# Patient Record
Sex: Male | Born: 1965 | Race: Black or African American | Hispanic: No | Smoking: Current every day smoker
Health system: Southern US, Community
[De-identification: ages and names within clinical notes are randomized; demographics above are authoritative.]

---

## 2015-04-07 ENCOUNTER — Emergency Department (HOSPITAL_COMMUNITY): Payer: Self-pay

## 2015-04-07 ENCOUNTER — Emergency Department (HOSPITAL_COMMUNITY)
Admission: EM | Admit: 2015-04-07 | Discharge: 2015-04-08 | Disposition: A | Discharge status: Home / Self Care | Payer: Self-pay | Attending: Emergency Medicine | Admitting: Emergency Medicine

## 2015-04-07 ENCOUNTER — Encounter (HOSPITAL_COMMUNITY): Payer: Self-pay | Admitting: *Deleted

## 2015-04-07 DIAGNOSIS — S59902A Unspecified injury of left elbow, initial encounter: Secondary | ICD-10-CM | POA: Insufficient documentation

## 2015-04-07 DIAGNOSIS — Z720 Tobacco use: Secondary | ICD-10-CM | POA: Insufficient documentation

## 2015-04-07 DIAGNOSIS — Y939 Activity, unspecified: Secondary | ICD-10-CM | POA: Insufficient documentation

## 2015-04-07 DIAGNOSIS — Y999 Unspecified external cause status: Secondary | ICD-10-CM | POA: Insufficient documentation

## 2015-04-07 DIAGNOSIS — M719 Bursopathy, unspecified: Secondary | ICD-10-CM

## 2015-04-07 DIAGNOSIS — W228XXA Striking against or struck by other objects, initial encounter: Secondary | ICD-10-CM | POA: Insufficient documentation

## 2015-04-07 DIAGNOSIS — Y929 Unspecified place or not applicable: Secondary | ICD-10-CM | POA: Insufficient documentation

## 2015-04-07 LAB — CBC WITH DIFFERENTIAL/PLATELET
BASOS ABS: 0.1 10*3/uL (ref 0.0–0.1)
BASOS PCT: 1 % (ref 0–1)
EOS PCT: 1 % (ref 0–5)
Eosinophils Absolute: 0.1 10*3/uL (ref 0.0–0.7)
HCT: 41.6 % (ref 39.0–52.0)
Hemoglobin: 14.6 g/dL (ref 13.0–17.0)
Lymphocytes Relative: 37 % (ref 12–46)
Lymphs Abs: 2.9 10*3/uL (ref 0.7–4.0)
MCH: 30.2 pg (ref 26.0–34.0)
MCHC: 35.1 g/dL (ref 30.0–36.0)
MCV: 86 fL (ref 78.0–100.0)
MONO ABS: 0.9 10*3/uL (ref 0.1–1.0)
Monocytes Relative: 11 % (ref 3–12)
Neutro Abs: 3.9 10*3/uL (ref 1.7–7.7)
Neutrophils Relative %: 50 % (ref 43–77)
PLATELETS: 242 10*3/uL (ref 150–400)
RBC: 4.84 MIL/uL (ref 4.22–5.81)
RDW: 13.8 % (ref 11.5–15.5)
WBC: 7.7 10*3/uL (ref 4.0–10.5)

## 2015-04-07 LAB — BASIC METABOLIC PANEL
Anion gap: 6 (ref 5–15)
BUN: 14 mg/dL (ref 6–20)
CO2: 28 mmol/L (ref 22–32)
CREATININE: 1.18 mg/dL (ref 0.61–1.24)
Calcium: 9 mg/dL (ref 8.9–10.3)
Chloride: 107 mmol/L (ref 101–111)
GFR calc Af Amer: >60 mL/min (ref >60)
GFR calc non Af Amer: >60 mL/min (ref >60)
Glucose, Bld: 91 mg/dL (ref 70–99)
Potassium: 3.7 mmol/L (ref 3.5–5.1)
SODIUM: 141 mmol/L (ref 135–145)

## 2015-04-07 LAB — GRAM STAIN: Gram Stain: NONE SEEN

## 2015-04-07 MED ORDER — LIDOCAINE-EPINEPHRINE (PF) 2 %-1:200000 IJ SOLN
20.0000 mL | Freq: Once | INTRAMUSCULAR | Status: AC
  Administered 2015-04-07: 20 mL via INTRADERMAL
  Filled 2015-04-07: qty 20

## 2015-04-07 MED ORDER — DEXTROSE 5 % IV SOLN
1.0000 g | Freq: Once | INTRAVENOUS | Status: AC
  Administered 2015-04-07: 1 g via INTRAVENOUS
  Filled 2015-04-07: qty 10

## 2015-04-07 NOTE — ED Notes (Signed)
The pt has a painful swollen lt elbow.  One week ago something stuck in the elbow and since then he has had more pain and swelling  No drainage.  Lesion to the lt elbow

## 2015-04-07 NOTE — ED Notes (Signed)
Family at bedside. 

## 2015-04-07 NOTE — ED Notes (Signed)
Pt reports he thinks he hit his left elbow about a week ago, states he looked down and saw it bleeding and yesterday he noticed a "lump" on his elbow, denies pain. Full ROM in said arm

## 2015-04-07 NOTE — ED Provider Notes (Signed)
CSN: [id number]     Arrival date & time 04/07/15  1900 History   First MD Initiated Contact with Patient 04/07/15 2059     Chief Complaint  Patient presents with  . Elbow Pain   HPI Patient presents to the emergency room with complaints of left-sided elbow pain. Tums all started a week ago when he hit his elbow on the object. He did have some bleeding after that occurred. Yesterday he noticed that his elbow started become more swollen. Denies any pain. He denies any fevers or chills. No drainage. History reviewed. No pertinent past medical history. History reviewed. No pertinent past surgical history. No family history on file. History  Substance Use Topics  . Smoking status: Current Every Day Smoker  . Smokeless tobacco: Not on file  . Alcohol Use: Yes    Review of Systems  All other systems reviewed and are negative.     Allergies  Review of patient's allergies indicates no known allergies.  Home Medications   Prior to Admission medications   Medication Sig Start Date End Date Taking? Authorizing Provider  cephALEXin (KEFLEX) 500 MG capsule Take 1 capsule (500 mg total) by mouth 4 (four) times daily. 04/08/15   Linwood DibblesJon Azuri Bozard, MD  naproxen (NAPROSYN) 500 MG tablet Take 1 tablet (500 mg total) by mouth 2 (two) times daily with a meal. As needed for pain 04/08/15   Linwood DibblesJon Gustie Bobb, MD   BP 126/74 mmHg  Pulse 72  Temp(Src) 98.8 F (37.1 C)  Resp 14  Ht 5\' 10"  (1.778 m)  Wt 185 lb (83.915 kg)  BMI 26.54 kg/m2  SpO2 94% Physical Exam  Constitutional: He appears well-developed and well-nourished. No distress.  HENT:  Head: Normocephalic and atraumatic.  Right Ear: External ear normal.  Left Ear: External ear normal.  Eyes: Conjunctivae are normal. Right eye exhibits no discharge. Left eye exhibits no discharge. No scleral icterus.  Neck: Neck supple. No tracheal deviation present.  Cardiovascular: Normal rate.   Pulmonary/Chest: Effort normal. No stridor. No respiratory distress.   Musculoskeletal: He exhibits no edema.       Left elbow: He exhibits swelling and effusion. He exhibits normal range of motion, no deformity and no laceration. No tenderness found.  Neurological: He is alert. Cranial nerve deficit: no gross deficits.  Skin: Skin is warm and dry. No rash noted.  Psychiatric: He has a normal mood and affect.  Nursing note and vitals reviewed.   ED Course  INCISION AND DRAINAGE Date/Time: 04/08/2015 12:09 AM Performed by: Linwood DibblesKNAPP, Astria Jordahl Authorized by: Linwood DibblesKNAPP, Sharen Youngren Consent: Verbal consent obtained. Risks and benefits: risks, benefits and alternatives were discussed Consent given by: patient Indications for incision and drainage: bursitis. Location: left elbow. Anesthesia: local infiltration Local anesthetic: lidocaine 2% with epinephrine Anesthetic total: 2 ml Patient sedated: no Needle gauge: 18 Complexity: simple Drainage: serous Drainage amount: scant Patient tolerance: Patient tolerated the procedure well with no immediate complications   (including critical care time) Labs Review Labs Reviewed  GRAM STAIN  BODY FLUID CULTURE  CBC WITH DIFFERENTIAL/PLATELET  BASIC METABOLIC PANEL    Imaging Review Dg Elbow Complete Left  04/07/2015   CLINICAL DATA:  History of puncture wound on left elbow 1 week ago with pain and swelling. Initial encounter.  EXAM: LEFT ELBOW - COMPLETE 3+ VIEW  COMPARISON:  None.  FINDINGS: No fracture, dislocation or joint effusion is identified. Soft tissue swelling over the olecranon is identified. Soft tissues are otherwise unremarkable.  IMPRESSION: Soft tissue swelling over  the olecranon is consistent with bursitis which could be septic or aseptic.  No bony abnormality is identified.  There is no joint effusion.   Electronically Signed   By: Drusilla Kannerhomas  Dalessio M.D.   On: 04/07/2015 19:55      MDM   Final diagnoses:  Bursitis    Fluid obtained did not appear purulent. I suspect an inflammatory bursitis. Gram stain did  not show any bacteria. Culture was sent off for analysis. I will send home on oral abx and nsaids.  Follow up with health and wellness.  Linwood DibblesJon Izabellah Dadisman, MD 04/08/15 [phone]

## 2015-04-08 MED ORDER — CEPHALEXIN 500 MG PO CAPS
500.0000 mg | ORAL_CAPSULE | Freq: Four times a day (QID) | ORAL | Status: AC
Start: 2015-04-08 — End: ?

## 2015-04-08 MED ORDER — NAPROXEN 500 MG PO TABS: 500.0000 mg | ORAL_TABLET | Freq: Two times a day (BID) | ORAL | Status: AC

## 2015-04-08 NOTE — Discharge Instructions (Signed)
Bursitis °Bursitis is a swelling and soreness (inflammation) of a fluid-filled sac (bursa) that overlies and protects a joint. It can be caused by injury, overuse of the joint, arthritis or infection. The joints most likely to be affected are the elbows, shoulders, hips and knees. °HOME CARE INSTRUCTIONS  °· Apply ice to the affected area for 15-20 minutes each hour while awake for 2 days. Put the ice in a plastic bag and place a towel between the bag of ice and your skin. °· Rest the injured joint as much as possible, but continue to put the joint through a full range of motion, 4 times per day. (The shoulder joint especially becomes rapidly "frozen" if not used.) When the pain lessens, begin normal slow movements and usual activities. °· Only take over-the-counter or prescription medicines for pain, discomfort or fever as directed by your caregiver. °· Your caregiver may recommend draining the bursa and injecting medicine into the bursa. This may help the healing process. °· Follow all instructions for follow-up with your caregiver. This includes any orthopedic referrals, physical therapy and rehabilitation. Any delay in obtaining necessary care could result in a delay or failure of the bursitis to heal and chronic pain. °SEEK IMMEDIATE MEDICAL CARE IF:  °· Your pain increases even during treatment. °· You develop an oral temperature above 102° F (38.9° C) and have heat and inflammation over the involved bursa. °MAKE SURE YOU:  °· Understand these instructions. °· Will watch your condition. °· Will get help right away if you are not doing well or get worse. °Document Released: 11/17/2000 Document Revised: 02/12/2012 Document Reviewed: 02/09/2014 °[employer]® Patient Information ©2015 [employer]. This information is not intended to replace advice given to you by your health care provider. Make sure you discuss any questions you have with your health care provider. ° °

## 2015-04-08 NOTE — ED Notes (Signed)
Patient is alert and orientedx4.  Patient was explained discharge instructions and they understood them with no questions.  The patient's friend, Aundria MemsJackie Woodard is taking the patient home.

## 2015-04-11 LAB — BODY FLUID CULTURE: Culture: NO GROWTH

## 2016-10-11 IMAGING — CR DG ELBOW COMPLETE 3+V*L*
4 series · 4 of 4 positions shown · non-contrast
Comparison: None.

CLINICAL DATA: History of puncture wound on left elbow 1 week ago
with pain and swelling. Initial encounter.

EXAM:
LEFT ELBOW - COMPLETE 3+ VIEW

[elbow ap]
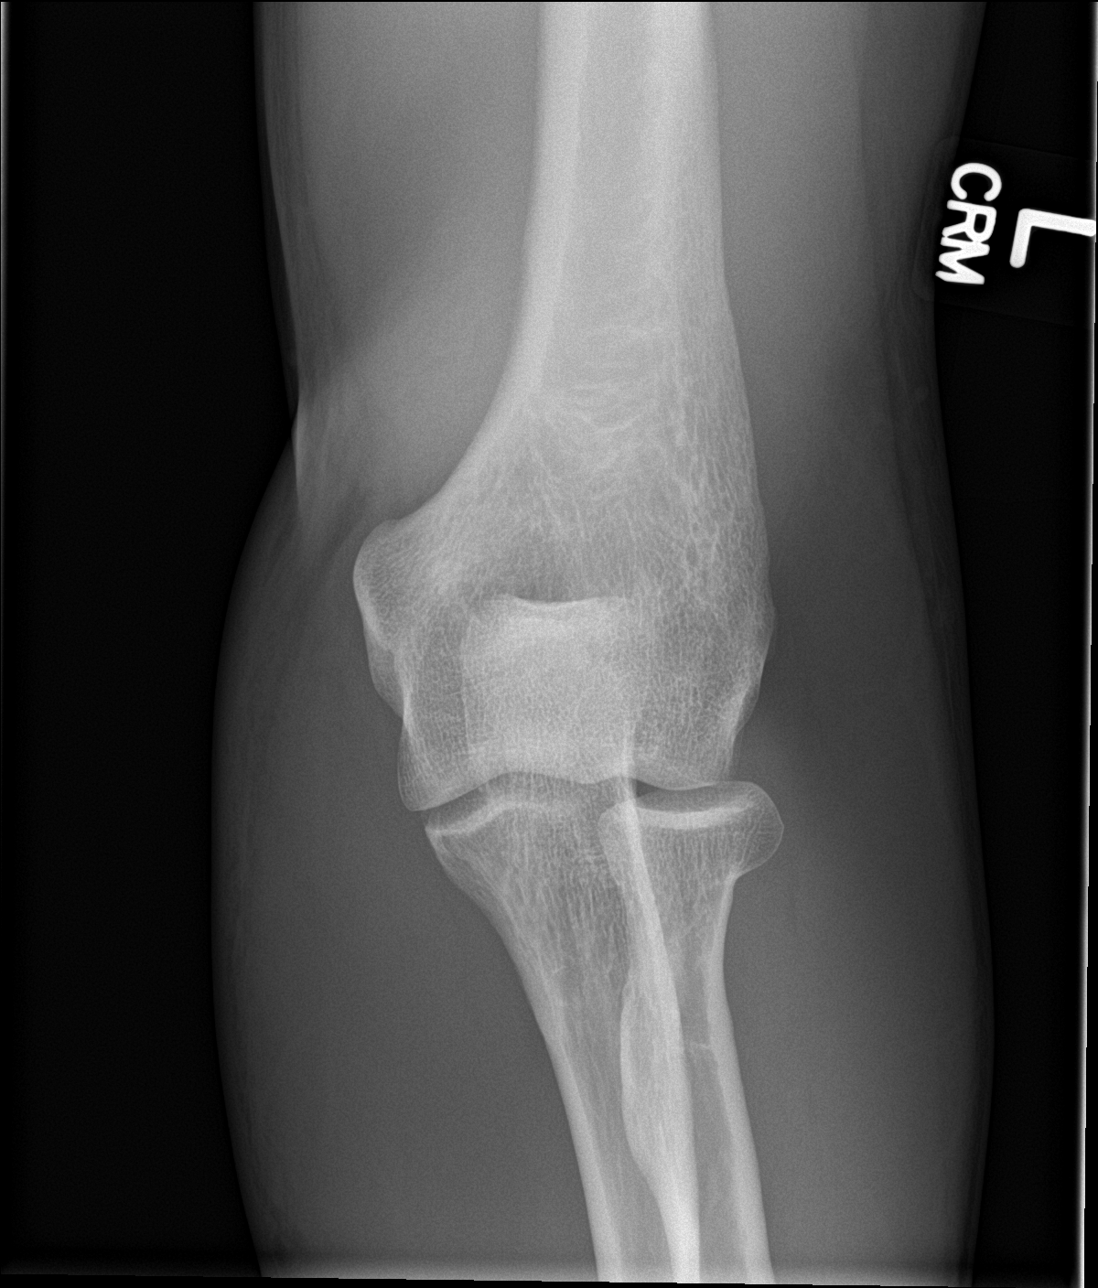

[elbow obl (1 of 2)]
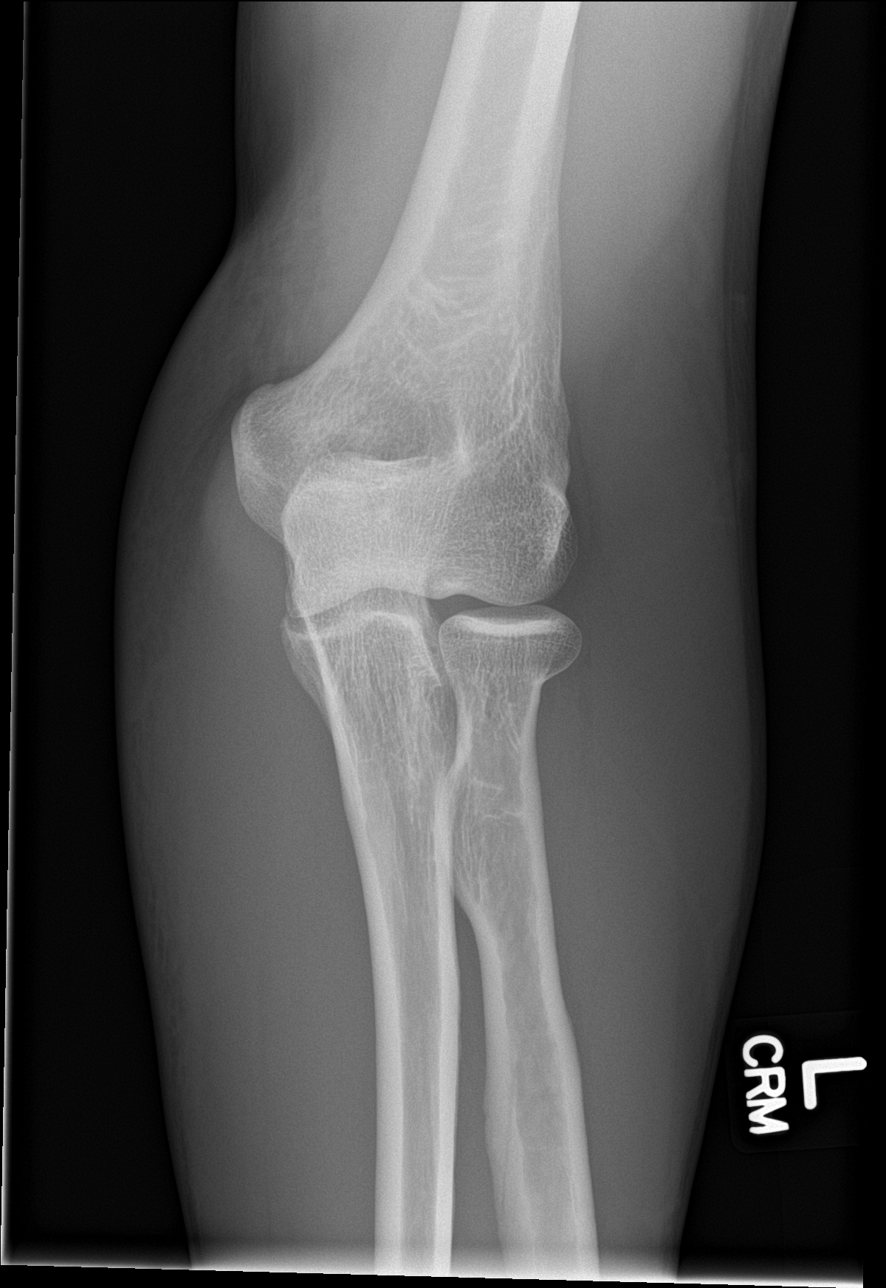

[elbow obl (2 of 2)]
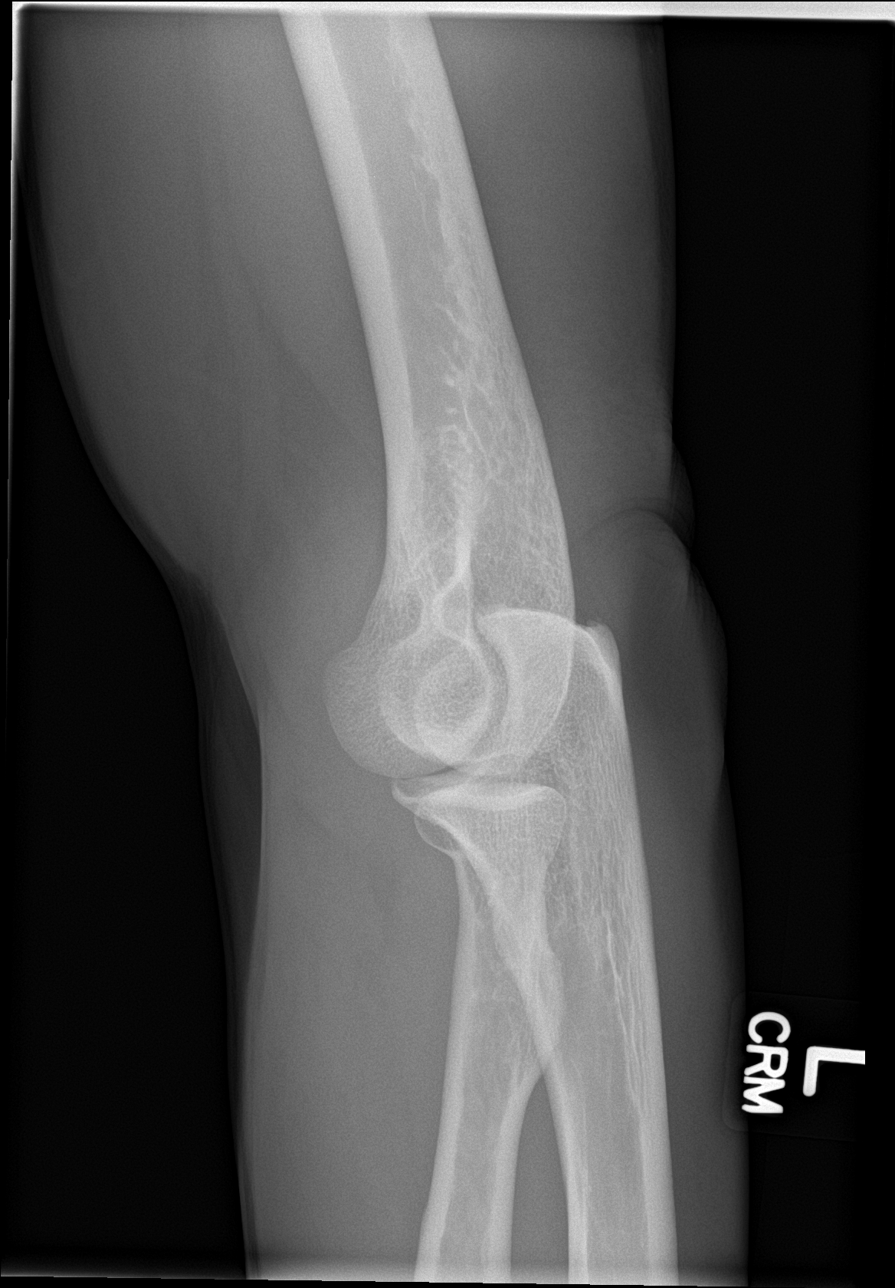

[elbow lat]
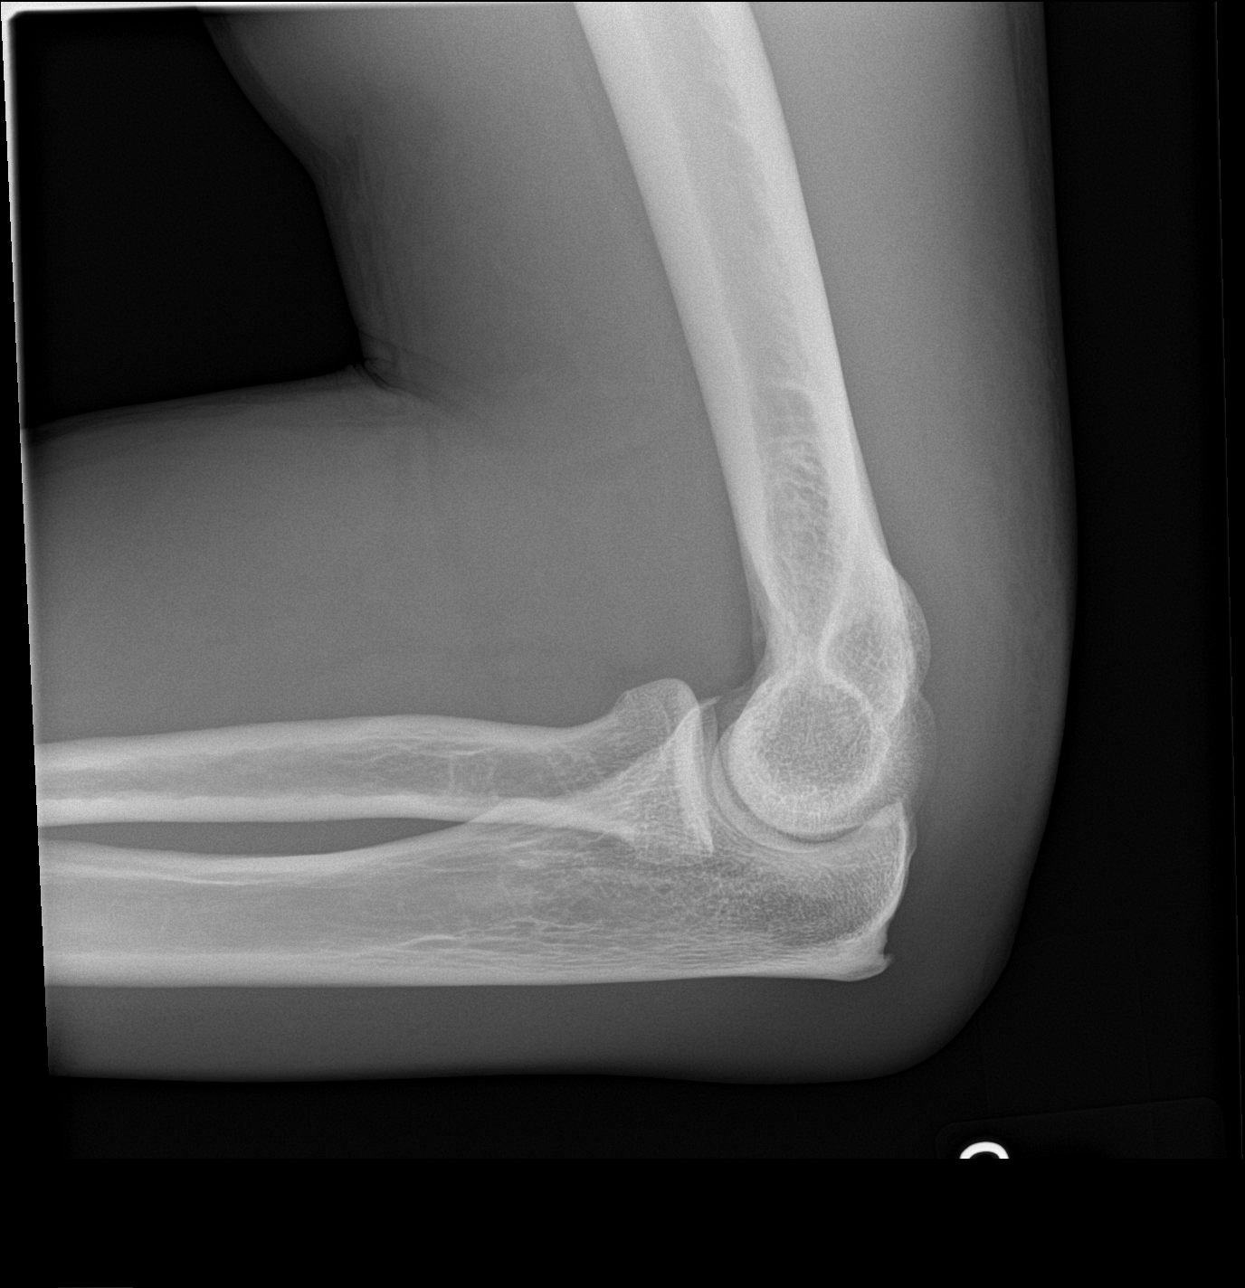

[4 of 4 positions shown; findings below may reference images not displayed]

FINDINGS: No fracture, dislocation or joint effusion is identified. Soft
tissue swelling over the olecranon is identified. Soft tissues are
otherwise unremarkable.
IMPRESSION: Soft tissue swelling over the olecranon is consistent with bursitis
which could be septic or aseptic.

No bony abnormality is identified.  There is no joint effusion.
